# Patient Record
Sex: Male | Born: 1990 | Hispanic: Yes | Marital: Single | Smoking: Never smoker
Health system: Southern US, Community
[De-identification: ages and names within clinical notes are randomized; demographics above are authoritative.]

## PROBLEM LIST (undated history)

## (undated) HISTORY — PX: FRACTURE SURGERY: SHX138

## (undated) HISTORY — PX: SURGERY SCROTAL / TESTICULAR: SUR1316

---

## 2014-09-16 HISTORY — PX: FINGER SURGERY: SHX640

## 2015-07-31 ENCOUNTER — Ambulatory Visit: Payer: Worker's Compensation

## 2015-07-31 ENCOUNTER — Ambulatory Visit (INDEPENDENT_AMBULATORY_CARE_PROVIDER_SITE_OTHER): Payer: Worker's Compensation | Admitting: Family Medicine

## 2015-07-31 VITALS — BP 138/88 | HR 83 | Temp 98.6°F | Resp 18 | Ht 66.25 in | Wt 172.8 lb

## 2015-07-31 DIAGNOSIS — S61215A Laceration without foreign body of left ring finger without damage to nail, initial encounter: Secondary | ICD-10-CM

## 2015-07-31 DIAGNOSIS — M79642 Pain in left hand: Secondary | ICD-10-CM

## 2015-07-31 DIAGNOSIS — S62605B Fracture of unspecified phalanx of left ring finger, initial encounter for open fracture: Secondary | ICD-10-CM

## 2015-07-31 MED ORDER — AMOXICILLIN-POT CLAVULANATE 875-125 MG PO TABS: 1.0000 | ORAL_TABLET | Freq: Two times a day (BID) | ORAL | Status: DC

## 2015-07-31 MED ORDER — HYDROCODONE-ACETAMINOPHEN 5-325 MG PO TABS: 1.0000 | ORAL_TABLET | Freq: Four times a day (QID) | ORAL | Status: AC | PRN

## 2015-07-31 NOTE — Progress Notes (Signed)
Procedure Consent obtained. Digital block of 3rd, 4th, and 5th digit with 9cc 1% lido. Cleaned with soap and water. Additional irrigation under faucet. #4, #5, #6 simple interrupted sutures with 5-0 ethilon on 3rd, 4th, and 5th digits respectively.   Splint applied to 4th digit by Dr. Patsy Lageropland and buddy taped.

## 2015-07-31 NOTE — Progress Notes (Signed)
Parker Warren 30-Mar-1991 24 y.o.   Chief Complaint  Parker Bubaatient presents with  . Laceration    left hand, 3rd, 4th, and 5th digits, workers comp     Date of Injury: today- 07/31/15  History of Present Illness:  Presents for evaluation of work-related complaint. He was working with a circular saw about an hour ago when he slipped and cut the palmer aspects of the long, ring and pinky fingers of his left hand.   He notes that he fractured the left ring finger last year and [facility]. He recalls that he broke the proximal phalanx and had 2 pins placed which have been removed since.  Since then he has not had full flexion of the finger, but cannot flex it at all now. He also notes reduced sensation in the ring finger.    He is otherwise generally healthy and unhurt Last ate around 9am, drank a little water after accident  He is right handed He feels certain that he had a tetanus shot within the last 5 years.    ROS    No Known Allergies   Current medications reviewed and updated. Past medical history, family history, social history have been reviewed and updated.   Physical Exam Filed Vitals:   07/31/15 1233  BP: 138/88  Pulse: 83  Temp: 98.6 F (37 C)  Resp: 18    GEN: WDWN, NAD, Non-toxic, Alert & Oriented x 3, looks well HEENT: Atraumatic, Normocephalic.  Ears and Nose: No external deformity. EXTR: No clubbing/cyanosis/edema NEURO: Normal gait.  PSYCH: Normally interactive. Conversant. Not depressed or anxious appearing.  Calm demeanor.  Left hand: he has lacerations over the middle phalanxes of the long, ring and pinky fingers.  He is able to feel the distal pinky finger and flex it against resistance. However he is not able to flex the ring finger really at all and notes numbness of the distal finger.  The long finger shows a smaller lac over the palmer aspect with slightly reduced strength to resisted flexion  [facility] reading (PRIMARY) by   Dr. Patsy Lageropland. Left hand: there appears to be an acute fracture at the distal aspect of the middle phalanx of the left ring finger.    LEFT HAND - COMPLETE 3+ VIEW  COMPARISON: None in PACs  FINDINGS: There is disruption of the soft tissues over the middle phalanx of the fourth finger. There is an underlying fracture. The fracture line reaches the articular surface. The distal phalanx and the proximal phalanx of the fourth finger are intact. The other digits appear intact as do the metacarpals and carpals.  IMPRESSION: There is an open fracture of the distal aspect of the middle phalanx of the fourth finger. The fracture line reaches the DIP joint.  Discussed with Dr. Janee Mornhompson who is on call for hand surgery today.  He asked us to clean and loosely close wounds, and he will see pt this week for definitive management.  We will also start antibiotics  Assessment and Plan: Laceration of left ring finger w/o foreign body w/o damage to nail, initial encounter - Plan: DG Hand Complete Left, amoxicillin-clavulanate (AUGMENTIN) 875-125 MG tablet, Ambulatory referral to Hand Surgery  Pain of left hand - Plan: DG Hand Complete Left, HYDROcodone-acetaminophen (NORCO/VICODIN) 5-325 MG tablet  Fracture of phalanx of left ring finger, open, initial encounter - Plan: amoxicillin-clavulanate (AUGMENTIN) 875-125 MG tablet, Ambulatory referral to Hand Surgery  Wounds repaired as per procedure note by Ms. Brewington,  PA-C.  All wounds dressed. Splinted ring finger and buddy taped to long finger.   Tetanus is UTD rx for pain and for augmentin Note to be OOW until cleared by hand surgery See patient instructions for more details.

## 2015-07-31 NOTE — Patient Instructions (Signed)
We are sorry that you got hurt today!  [facility] hear [facility], the office of Dr. Mack Hookavid Thompson this week. If you do not hear by Wednesday or so please call me You have a fracture of the ring finger and lacerations of the middle and pinky fingers.  Please leave the bandages and splint in place, and keep the wounds clean and dry Take the augmentin antibiotic to prevent infection.  Use the pain medication as needed but remember it will make you sleepy You can also use ibuprofen or aleve as needed  If you are getting worse or have any other concerns please call or come back in No work until cleared by Dr. Janee Mornhompson

## 2016-07-10 IMAGING — CR DG HAND COMPLETE 3+V*L*
3 series · 3 of 3 positions shown · non-contrast
Comparison: None in PACs

CLINICAL DATA: Left hand was called in the in a circular saw with
injury of the fourth finger.

EXAM:
LEFT HAND - COMPLETE 3+ VIEW

[PA]
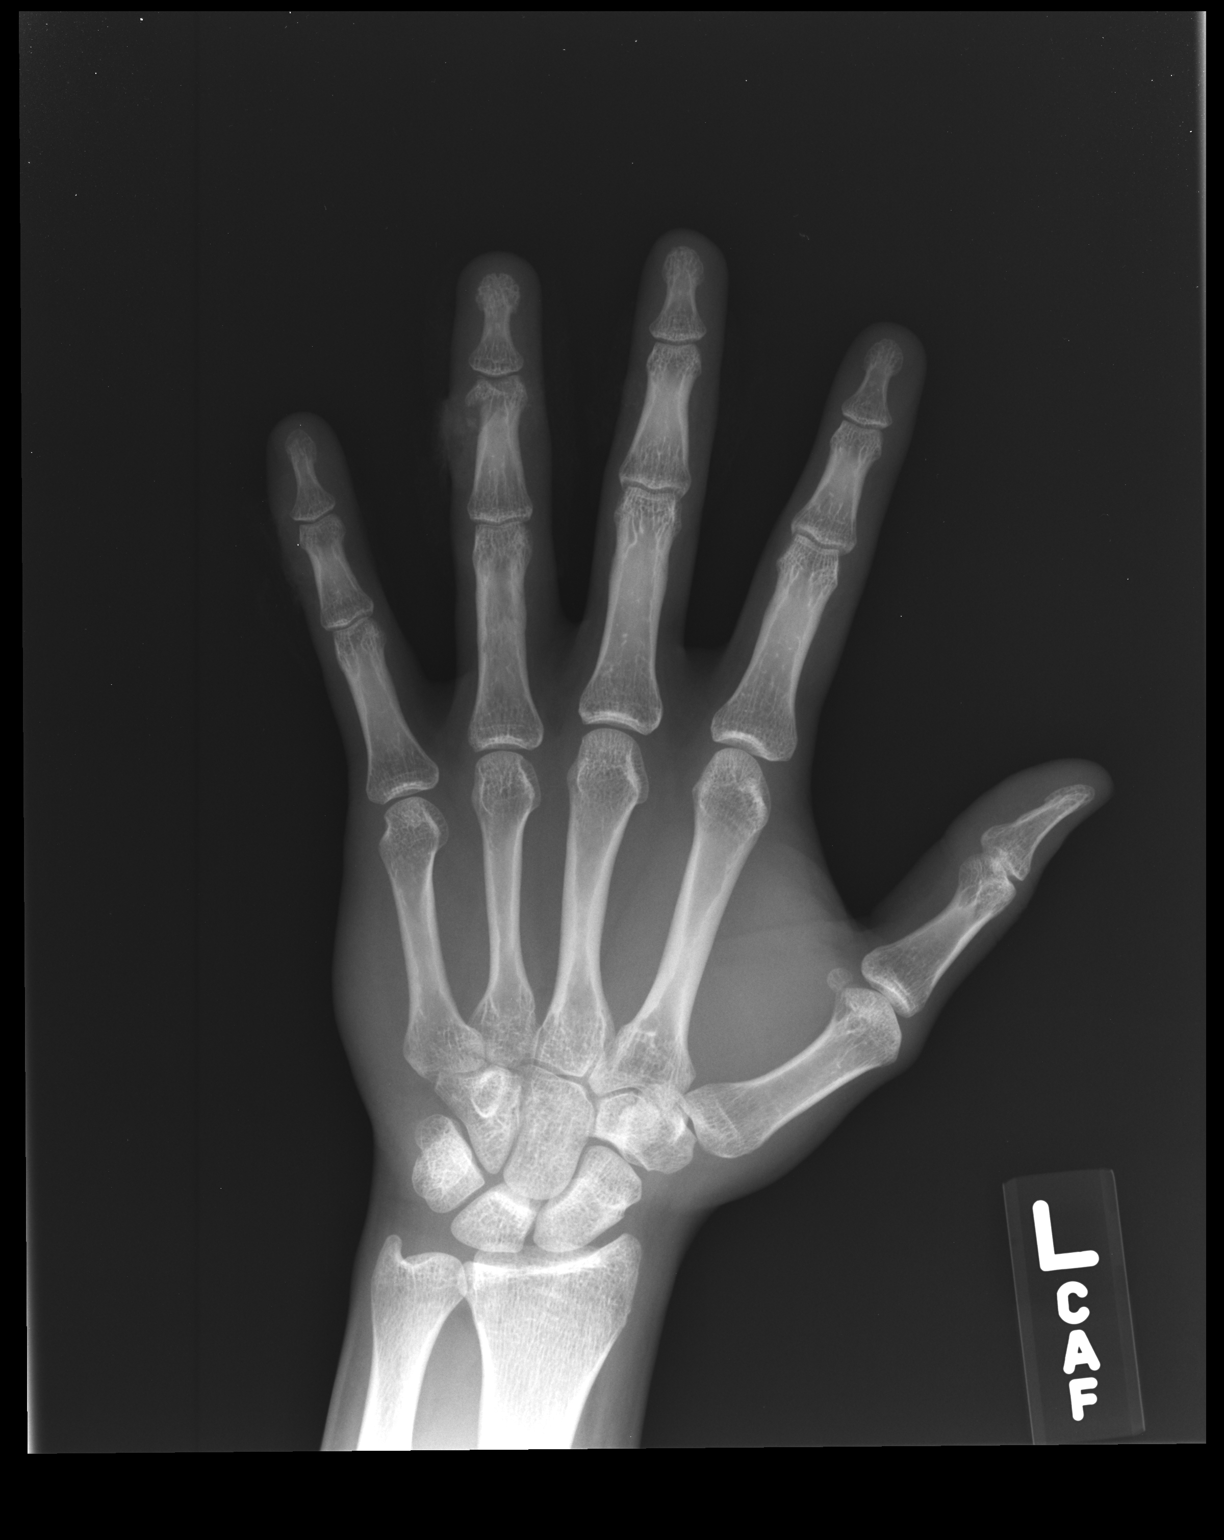

[pa obl]
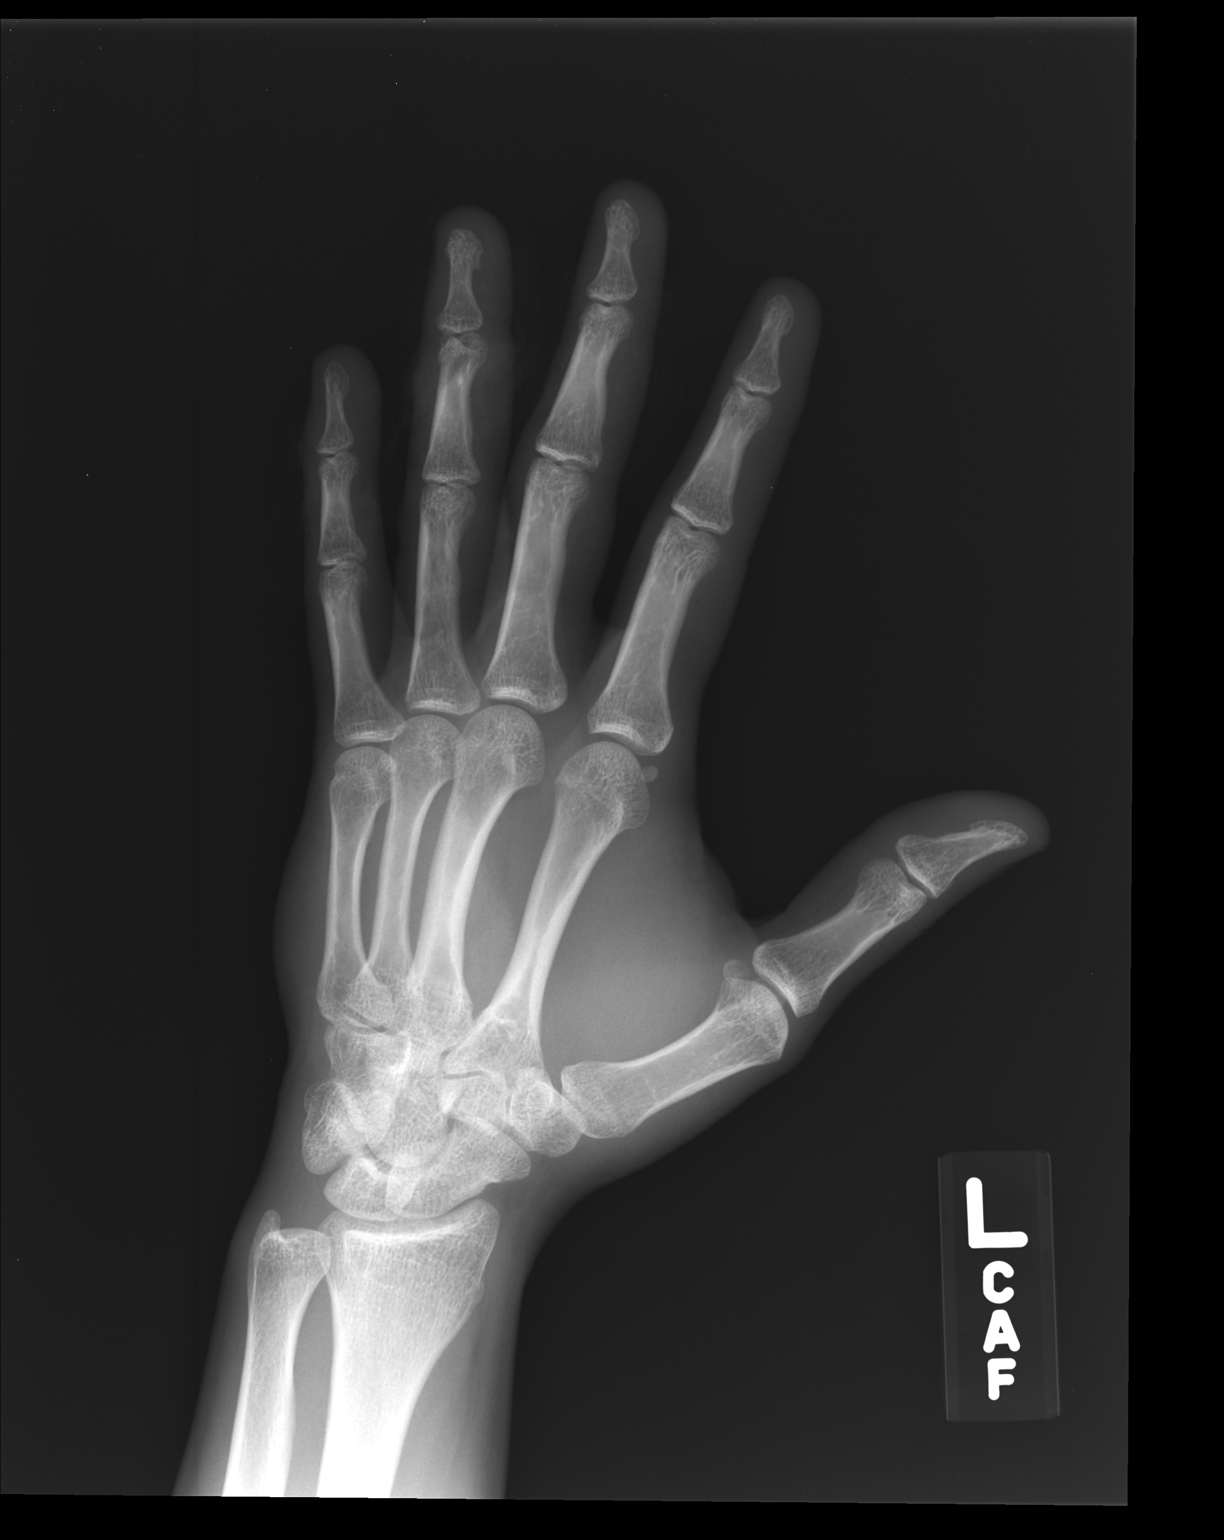

[lateral]
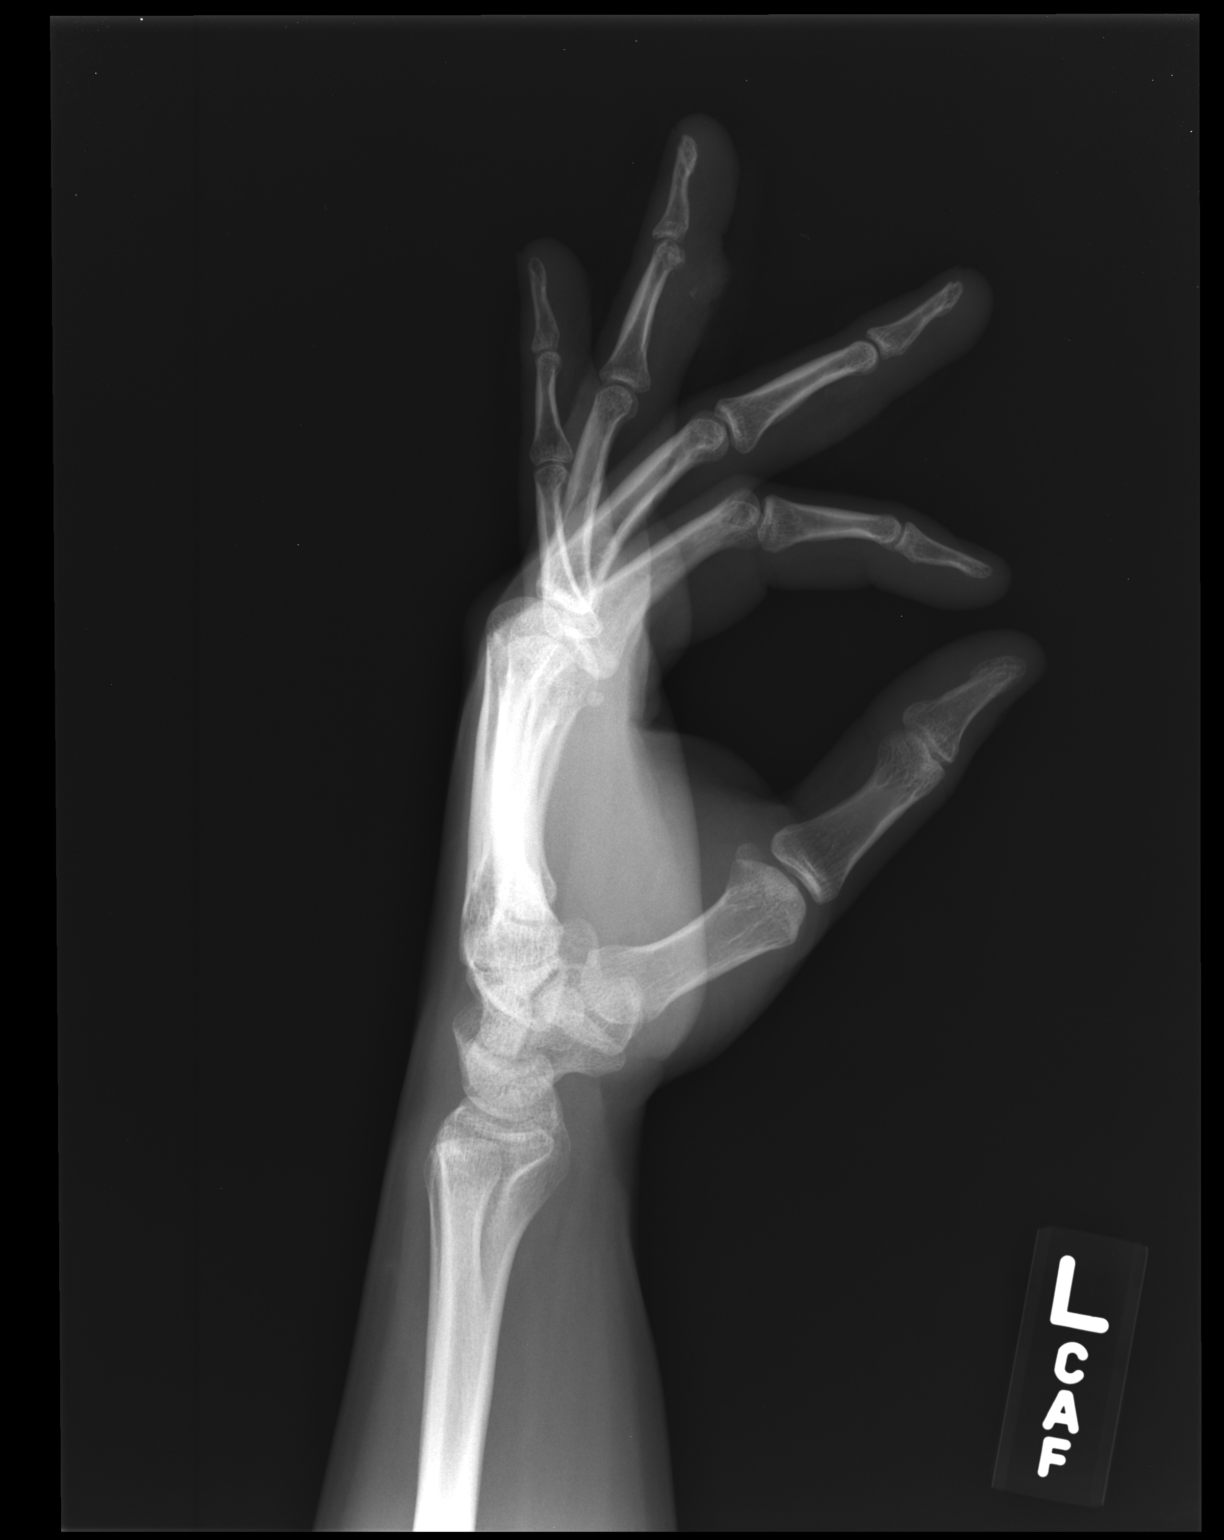

[3 of 3 positions shown; findings below may reference images not displayed]

FINDINGS: There is disruption of the soft tissues over the middle phalanx of
the fourth finger. There is an underlying fracture. The fracture
line reaches the articular surface. The distal phalanx and the
proximal phalanx of the fourth finger are intact. The other digits
appear intact as do the metacarpals and carpals.
IMPRESSION: There is an open fracture of the distal aspect of the middle phalanx
of the fourth finger. The fracture line reaches the DIP joint.

## 2016-08-15 ENCOUNTER — Ambulatory Visit (INDEPENDENT_AMBULATORY_CARE_PROVIDER_SITE_OTHER): Payer: 59 | Admitting: Family Medicine

## 2016-08-15 VITALS — BP 138/88 | HR 95 | Temp 98.9°F | Resp 17 | Ht 66.25 in | Wt 186.0 lb

## 2016-08-15 DIAGNOSIS — J069 Acute upper respiratory infection, unspecified: Secondary | ICD-10-CM | POA: Diagnosis not present

## 2016-08-15 DIAGNOSIS — R6889 Other general symptoms and signs: Secondary | ICD-10-CM | POA: Diagnosis not present

## 2016-08-15 LAB — POCT INFLUENZA A/B
INFLUENZA A, POC: NEGATIVE
INFLUENZA B, POC: NEGATIVE

## 2016-08-15 MED ORDER — FLUTICASONE PROPIONATE 50 MCG/ACT NA SUSP: 2.0000 | Freq: Every day | NASAL | 6 refills | Status: AC

## 2016-08-15 MED ORDER — LORATADINE 10 MG PO TABS: 10.0000 mg | ORAL_TABLET | Freq: Every day | ORAL | 11 refills | Status: AC

## 2016-08-15 NOTE — Progress Notes (Signed)
  Chief Complaint  Patient presents with  . Sore Throat    Congestion x1week    HPI Upper Respiratory Infection: Patient complains of symptoms of a URI. Symptoms include cough, fever and sore throat. Onset of symptoms was 1 week ago, gradually worsening since that time. He also c/o low grade fever, nasal congestion, non productive cough and purulent nasal discharge for the past 7 days .  He is drinking plenty of fluids. Evaluation to date: none. Treatment to date: cough suppressants and alleve due to his body aches. He has not received a flu shot Does not have asthma Reports some seasonal allergies    No past medical history on file.  Current Outpatient Prescriptions  Medication Sig Dispense Refill  . guaiFENesin (MUCINEX) 600 MG 12 hr tablet Take by mouth 2 (two) times daily.    . fluticasone (FLONASE) 50 MCG/ACT nasal spray Place 2 sprays into both nostrils daily. 16 g 6  . HYDROcodone-acetaminophen (NORCO/VICODIN) 5-325 MG tablet Take 1 tablet by mouth every 6 (six) hours as needed. (Patient not taking: Reported on 08/15/2016) 20 tablet 0  . loratadine (CLARITIN) 10 MG tablet Take 1 tablet (10 mg total) by mouth daily. 30 tablet 11   No current facility-administered medications for this visit.     Allergies: No Known Allergies  Past Surgical History:  Procedure Laterality Date  . FRACTURE SURGERY Left    left hand 4th digit  . SURGERY SCROTAL / TESTICULAR      Social History   Social History  . Marital status: Single    Spouse name: N/A  . Number of children: N/A  . Years of education: N/A   Social History Main Topics  . Smoking status: Never Smoker  . Smokeless tobacco: None  . Alcohol use None  . Drug use: Unknown  . Sexual activity: Not Asked   Other Topics Concern  . None   Social History Narrative  . None    ROS  Objective: Vitals:   08/15/16 1526  BP: 138/88  Pulse: 95  Resp: 17  Temp: 98.9 F (37.2 C)  TempSrc: Oral  SpO2: 98%  Weight:  186 lb (84.4 kg)  Height: 5' 6.25" (1.683 m)    Physical Exam General: alert, oriented, in NAD Head: normocephalic, atraumatic, no sinus tenderness Eyes: EOM intact, no scleral icterus or conjunctival injection Ears: TM clear bilaterally Throat: no pharyngeal exudate or erythema Nose: erythema of nares Lymph: no posterior auricular, submental or cervical lymph adenopathy Heart: normal rate, normal sinus rhythm, no murmurs Lungs: clear to auscultation bilaterally, no wheezing  Influenza neg  Assessment and Plan Axiel was seen today for sore throat.  Diagnoses and all orders for this visit:  Flu-like symptoms -     POCT Influenza A/B  Acute URI Advised nasal spray and antihistamine Discussed that viral causes are most likely so will not use antibiotics at this time Advised supportive care with cepachol lozenge for sore throat -     loratadine (CLARITIN) 10 MG tablet; Take 1 tablet (10 mg total) by mouth daily. -     fluticasone (FLONASE) 50 MCG/ACT nasal spray; Place 2 sprays into both nostrils daily.     Jhanae Jaskowiak A Floyed Masoud

## 2016-08-15 NOTE — Patient Instructions (Addendum)
IF you received an x-ray today, you will receive an invoice from [facility]. Please contact [facility] at [phone] with questions or concerns regarding your invoice.   IF you received labwork today, you will receive an invoice from [facility]. Please contact [facility] at [phone] with questions or concerns regarding your invoice.   Our billing staff will not be able to assist you with questions regarding bills from these companies.  You will be contacted with the lab results as soon as they are available. The fastest way to get your results is to activate your My Chart account. Instructions are located on the last page of this paperwork. If you have not heard from us regarding the results in 2 weeks, please contact this office.      Infeccin respiratoria viral (Viral Respiratory Infection) Una infeccin respiratoria viral es una enfermedad que afecta las partes del cuerpo que se usan para respirar, Toll Brotherscomo los pulmones, la nariz y Administratorla garganta. Es causada por un germen llamado virus. Algunos ejemplos de este tipo de infeccin son los siguientes:  Un resfro.  La gripe (influenza).  Una infeccin por el virus sincicial respiratorio (VSR). CMO S SI TENGO ESTA INFECCIN? La mayora de las veces, esta infeccin causa lo siguiente:  Secrecin o congestin nasal.  Lquido verde o amarillo en la nariz.  Tos.  Estornudos.  Cansancio (fatiga).  Dolores musculares.  Dolor de Advertising copywritergarganta.  Sudoracin o escalofros.  Grant RutsFiebre.  Dolor de Turkmenistancabeza. CMO SE TRATA ESTA INFECCIN? Si la gripe se diagnostica en forma temprana, se puede tratar con un medicamento antiviral. Este medicamento acorta el tiempo en que una persona tiene los sntomas. Los sntomas se pueden tratar con medicamentos de venta libre y recetados, como por ejemplo:  Expectorantes. Estos medicamentos facilitan la expulsin del moco al toser.  Descongestivo nasal  en aerosol. Los mdicos no recetan antibiticos para las infecciones virales. No funcionan para este tipo de infeccin. CMO S SI DEBO QUEDARME EN CASA? Para evitar que otros se contagien, Surveyor, miningpermanezca en su casa si tiene los siguientes sntomas:  ZeandaleFiebre.  Tos persistente.  Dolor de Advertising copywritergarganta.  Secrecin nasal.  Estornudos.  Dolores musculares.  Dolores de Turkmenistancabeza.  Cansancio.  Debilidad.  Escalofros.  Sudoracin.  Malestar estomacal (nuseas). CUIDADOS EN EL HOGAR  Descanse todo lo que pueda.  CenterPoint Energyome los medicamentos de venta libre y los recetados solamente como se lo haya indicado el mdico.  Beba suficiente lquido para Pharmacologistmantener el pis (orina) claro o de color amarillo plido.  Hgase grgaras con agua con sal. Haga esto entre 3 y 4 veces por da, o las veces que considere necesario. Para preparar la mezcla de agua con sal, disuelva de media a 1cucharadita de sal en 1taza de agua tibia. Asegrese de que la sal se disuelva por completo.  Use gotas para la nariz hechas con agua salada. Estas ayudan con la secrecin (congestin). Tambin ayudan a Chartered loss adjustersuavizar la piel alrededor de Architectural technologistla nariz.  No beba alcohol.  No consuma productos que contengan tabaco, incluidos cigarrillos, tabaco de Theatre managermascar y Administrator, Civil Servicecigarrillos electrnicos. Si necesita ayuda para dejar de fumar, consulte al mdico. SOLICITE AYUDA SI:  Los sntomas duran 10das o ms.  Los sntomas empeoran con Allied Waste Industriesel tiempo.  Tiene fiebre.  Repentinamente, siente un dolor muy intenso en el rostro o la cabeza.  Se inflaman mucho algunas partes de la mandbula o del cuello. SOLICITE AYUDA DE INMEDIATO SI:  Siente dolor u opresin en el pecho.  Le falta el aire.  Se siente mareado o como si fuera a desmayarse.  No deja de vomitar.  Se siente confundido. Esta informacin no tiene Theme park managercomo fin reemplazar el consejo del mdico. Asegrese de hacerle al mdico cualquier pregunta que tenga. Document Released: 02/04/2011 Document  Revised: 12/25/2015 Document Reviewed: 02/08/2015 Elsevier Interactive Patient Education  2017 [employer]

## 2020-09-27 ENCOUNTER — Other Ambulatory Visit: Payer: Self-pay

## 2020-09-27 ENCOUNTER — Ambulatory Visit
Admission: EM | Admit: 2020-09-27 | Discharge: 2020-09-27 | Disposition: A | Discharge status: Home / Self Care | Payer: 59 | Attending: Sports Medicine | Admitting: Sports Medicine

## 2020-09-27 DIAGNOSIS — J069 Acute upper respiratory infection, unspecified: Secondary | ICD-10-CM | POA: Diagnosis present

## 2020-09-27 DIAGNOSIS — R059 Cough, unspecified: Secondary | ICD-10-CM | POA: Diagnosis present

## 2020-09-27 DIAGNOSIS — R5081 Fever presenting with conditions classified elsewhere: Secondary | ICD-10-CM | POA: Diagnosis present

## 2020-09-27 DIAGNOSIS — J029 Acute pharyngitis, unspecified: Secondary | ICD-10-CM | POA: Insufficient documentation

## 2020-09-27 DIAGNOSIS — Z1152 Encounter for screening for COVID-19: Secondary | ICD-10-CM | POA: Insufficient documentation

## 2020-09-27 LAB — SARS CORONAVIRUS 2 (TAT 6-24 HRS): SARS Coronavirus 2: NEGATIVE

## 2020-09-27 NOTE — ED Triage Notes (Signed)
Patient presents to Urgent Care with complaints of sore throat since Friday night, low grade fever, and woke up congested this morning.  Treating at home with Tylenol and Nyquil.

## 2020-09-27 NOTE — Discharge Instructions (Addendum)
Recommended going ahead and getting COVID testing. We'll send it off to the hospital. Will take between six and 24 hours to return. Per current CDC guidelines he needs to isolate until the test comes back. If it is negative he can return to work with a mask if he is asymptomatic. If it comes back positive then he needs to follow the current CDC guidelines. I gave him a work note keeping him out of work and he can return on January 14. He'll be out today and tomorrow pending his results. He has been isolating at home and sleeping in a separate room from his wife and staying away from his family. Over-the-counter meds as needed, Tylenol or Motrin for fever discomfort. Cough syrup for a cough, Mucinex for congestion. Red flag signs and symptoms were discussed in detail and when to seek out immediate medical attention. He voiced verbal understanding. He knows to go to the ER or call 911 if his symptoms were to get worse. He'll follow-up with [place] as needed.

## 2020-09-27 NOTE — ED Provider Notes (Signed)
[facility]    CSN: [id number] Arrival date & time: 09/27/20  1191      History   Chief Complaint Chief Complaint  Patient presents with  . Sore Throat    HPI Parker Warren is a 30 y.o. male.   Pleasant 30 year old male who presents for evaluation of URI type symptoms. He said that his symptoms began with a sore throat about 5 days ago. It progressed to some congestion about 3 days ago and yesterday started getting a nonproductive cough and fever to 101 at home. Been taking Tylenol to control the fever. No chest pain shortness of breath. He is not vaccinated against COVID or influenza. He has had no COVID or influenza exposure. No strep exposure. No nausea vomiting diarrhea. No abdominal pain or urinary symptoms. He works in [employer] so he is outside and can social distance and use a mask. No red flag signs or symptoms elicited on history.     History reviewed. No pertinent past medical history.  There are no problems to display for this patient.   Past Surgical History:  Procedure Laterality Date  . FINGER SURGERY Left 2016  . FRACTURE SURGERY Left    left hand 4th digit  . SURGERY SCROTAL / TESTICULAR         Home Medications    Prior to Admission medications   Medication Sig Start Date End Date Taking? Authorizing Provider  fluticasone (FLONASE) 50 MCG/ACT nasal spray Place 2 sprays into both nostrils daily. 08/15/16   Doristine Bosworth, MD  guaiFENesin (MUCINEX) 600 MG 12 hr tablet Take by mouth 2 (two) times daily.    [provider]  HYDROcodone-acetaminophen (NORCO/VICODIN) 5-325 MG tablet Take 1 tablet by mouth every 6 (six) hours as needed. Patient not taking: No sig reported 07/31/15   Copland, Gwenlyn Found, MD  loratadine (CLARITIN) 10 MG tablet Take 1 tablet (10 mg total) by mouth daily. 08/15/16   Doristine Bosworth, MD    Family History Family History  Problem Relation Age of Onset  . Healthy Mother   . Hypertension Father      Social History Social History   Tobacco Use  . Smoking status: Never Smoker  . Smokeless tobacco: Never Used     Allergies   Patient has no known allergies.   Review of Systems Review of Systems  Constitutional: Positive for fever. Negative for activity change, appetite change, chills and fatigue.  HENT: Positive for congestion and sore throat. Negative for ear pain, postnasal drip, rhinorrhea, sinus pressure and sinus pain.   Eyes: Negative for pain.  Respiratory: Positive for cough. Negative for apnea, chest tightness, shortness of breath, wheezing and stridor.   Cardiovascular: Negative for chest pain and palpitations.  Gastrointestinal: Negative for abdominal pain.  Genitourinary: Negative for dysuria.  Musculoskeletal: Positive for myalgias. Negative for back pain, neck pain and neck stiffness.  Skin: Negative for color change, pallor, rash and wound.  Neurological: Negative for dizziness, weakness, light-headedness and headaches.  All other systems reviewed and are negative.    Physical Exam Triage Vital Signs ED Triage Vitals  Enc Vitals Group     BP 09/27/20 0914 (!) 147/100     Pulse Rate 09/27/20 0914 92     Resp 09/27/20 0914 17     Temp 09/27/20 0914 98.5 F (36.9 C)     Temp Source 09/27/20 0914 Oral     SpO2 09/27/20 0914 97 %     Weight 09/27/20 0913  190 lb (86.2 kg)     Height --      Head Circumference --      Peak Flow --      Pain Score 09/27/20 0910 0     Pain Loc --      Pain Edu? --      Excl. in GC? --    No data found.  Updated Vital Signs BP (!) 147/100 (BP Location: Left Arm)   Pulse 92   Temp 98.5 F (36.9 C) (Oral)   Resp 17   Wt 86.2 kg   SpO2 97%   BMI 30.44 kg/m   Visual Acuity Right Eye Distance:   Left Eye Distance:   Bilateral Distance:    Right Eye Near:   Left Eye Near:    Bilateral Near:     Physical Exam Vitals and nursing note reviewed.  Constitutional:      General: He is not in acute distress.     Appearance: He is well-developed. He is not ill-appearing, toxic-appearing or diaphoretic.  HENT:     Head: Normocephalic and atraumatic.     Right Ear: Tympanic membrane normal.     Left Ear: Tympanic membrane normal.     Nose: Congestion present. No rhinorrhea.     Mouth/Throat:     Mouth: Mucous membranes are moist.     Pharynx: Posterior oropharyngeal erythema present. No pharyngeal swelling or oropharyngeal exudate.     Tonsils: No tonsillar exudate or tonsillar abscesses. 1+ on the right. 1+ on the left.  Eyes:     Conjunctiva/sclera: Conjunctivae normal.     Pupils: Pupils are equal, round, and reactive to light.  Neck:     Thyroid: No thyromegaly.  Cardiovascular:     Rate and Rhythm: Normal rate and regular rhythm.     Heart sounds: Normal heart sounds. No murmur heard. No friction rub. No gallop.   Pulmonary:     Effort: Pulmonary effort is normal. No respiratory distress.     Breath sounds: Normal breath sounds. No stridor. No wheezing, rhonchi or rales.  Musculoskeletal:     Cervical back: Normal range of motion and neck supple.  Lymphadenopathy:     Cervical: Cervical adenopathy present.  Skin:    General: Skin is warm and dry.     Capillary Refill: Capillary refill takes less than 2 seconds.  Neurological:     General: No focal deficit present.     Mental Status: He is alert.  Psychiatric:        Mood and Affect: Mood normal.        Behavior: Behavior normal.      UC Treatments / Results  Labs (all labs ordered are listed, but only abnormal results are displayed) Labs Reviewed  SARS CORONAVIRUS 2 (TAT 6-24 HRS)    EKG   Radiology No results found.  Procedures Procedures (including critical care time)  Medications Ordered in UC Medications - No data to display  Initial Impression / Assessment and Plan / UC Course  I have reviewed the triage vital signs and the nursing notes.  Pertinent labs & imaging results that were available during my care  of the patient were reviewed by me and considered in my medical decision making (see chart for details).   Clinical impression: 30 year old male with 5 days of sore throat, 3 days of congestion, and fever that began yesterday with associated nonproductive cough. He is not vaccinated.  Treatment plan:  1. The findings and treatment  plan were discussed in detail with the patient. Patient was in agreement.  2. Recommended going ahead and getting COVID testing. We'll send it off to the hospital. Will take between six and 24 hours to return. Per current CDC guidelines he needs to isolate until the test comes back. If it is negative he can return to work with a mask if he is asymptomatic. If it comes back positive then he needs to follow the current CDC guidelines.  3. I gave him a work note keeping him out of work and he can return on January 14. He'll be out today and tomorrow pending his results. He has been isolating at home and sleeping in a separate room from his wife and staying away from his family.  4. Over-the-counter meds as needed, Tylenol or Motrin for fever discomfort. Cough syrup for a cough, Mucinex for congestion.  5. Red flag signs and symptoms were discussed in detail and when to seek out immediate medical attention. He voiced verbal understanding. He knows to go to the ER or call 911 if his symptoms were to get worse.  6. He'll follow-up with [place] as needed.    Final Clinical Impressions(s) / UC Diagnoses   Final diagnoses:  Encounter for screening for COVID-19  Sore throat  Viral upper respiratory tract infection  Fever in other diseases  Cough     Discharge Instructions     Recommended going ahead and getting COVID testing. We'll send it off to the hospital. Will take between six and 24 hours to return. Per current CDC guidelines he needs to isolate until the test comes back. If it is negative he can return to work with a mask if he is asymptomatic. If it comes back positive  then he needs to follow the current CDC guidelines. I gave him a work note keeping him out of work and he can return on January 14. He'll be out today and tomorrow pending his results. He has been isolating at home and sleeping in a separate room from his wife and staying away from his family. Over-the-counter meds as needed, Tylenol or Motrin for fever discomfort. Cough syrup for a cough, Mucinex for congestion. Red flag signs and symptoms were discussed in detail and when to seek out immediate medical attention. He voiced verbal understanding. He knows to go to the ER or call 911 if his symptoms were to get worse. He'll follow-up with [place] as needed.    ED Prescriptions    None     PDMP not reviewed this encounter.   Delton See, MD 09/27/20 [phone]
